# Patient Record
Sex: Female | Born: 1970 | Race: White | Hispanic: No | Marital: Single | Smoking: Current every day smoker
Health system: Southern US, Community
[De-identification: ages and names within clinical notes are randomized; demographics above are authoritative.]

## PROBLEM LIST (undated history)

## (undated) DIAGNOSIS — F329 Major depressive disorder, single episode, unspecified: Secondary | ICD-10-CM

## (undated) DIAGNOSIS — F32A Depression, unspecified: Secondary | ICD-10-CM

## (undated) HISTORY — PX: ABDOMINAL HYSTERECTOMY: SHX81

## (undated) HISTORY — PX: APPENDECTOMY: SHX54

---

## 2016-07-24 ENCOUNTER — Encounter: Payer: Self-pay | Admitting: Emergency Medicine

## 2016-07-24 ENCOUNTER — Emergency Department
Admission: EM | Admit: 2016-07-24 | Discharge: 2016-07-24 | Disposition: A | Discharge status: Home / Self Care | Payer: No Typology Code available for payment source | Attending: Emergency Medicine | Admitting: Emergency Medicine

## 2016-07-24 ENCOUNTER — Emergency Department: Payer: No Typology Code available for payment source

## 2016-07-24 DIAGNOSIS — S60411A Abrasion of left index finger, initial encounter: Secondary | ICD-10-CM | POA: Insufficient documentation

## 2016-07-24 DIAGNOSIS — S199XXA Unspecified injury of neck, initial encounter: Secondary | ICD-10-CM | POA: Diagnosis present

## 2016-07-24 DIAGNOSIS — F17210 Nicotine dependence, cigarettes, uncomplicated: Secondary | ICD-10-CM | POA: Insufficient documentation

## 2016-07-24 DIAGNOSIS — Y92410 Unspecified street and highway as the place of occurrence of the external cause: Secondary | ICD-10-CM | POA: Insufficient documentation

## 2016-07-24 DIAGNOSIS — R51 Headache: Secondary | ICD-10-CM | POA: Diagnosis not present

## 2016-07-24 DIAGNOSIS — Y9389 Activity, other specified: Secondary | ICD-10-CM | POA: Diagnosis not present

## 2016-07-24 DIAGNOSIS — Y999 Unspecified external cause status: Secondary | ICD-10-CM | POA: Insufficient documentation

## 2016-07-24 DIAGNOSIS — S161XXA Strain of muscle, fascia and tendon at neck level, initial encounter: Secondary | ICD-10-CM | POA: Diagnosis not present

## 2016-07-24 HISTORY — DX: Major depressive disorder, single episode, unspecified: F32.9

## 2016-07-24 HISTORY — DX: Depression, unspecified: F32.A

## 2016-07-24 MED ORDER — CYCLOBENZAPRINE HCL 10 MG PO TABS: 10.0000 mg | ORAL_TABLET | Freq: Three times a day (TID) | ORAL | 0 refills | Status: AC | PRN

## 2016-07-24 MED ORDER — NAPROXEN 500 MG PO TABS: 500.0000 mg | ORAL_TABLET | Freq: Two times a day (BID) | ORAL | 0 refills | Status: AC

## 2016-07-24 MED ORDER — ORPHENADRINE CITRATE 30 MG/ML IJ SOLN
60.0000 mg | Freq: Once | INTRAMUSCULAR | Status: AC
  Administered 2016-07-24: 60 mg via INTRAMUSCULAR
  Filled 2016-07-24 (×2): qty 2

## 2016-07-24 MED ORDER — KETOROLAC TROMETHAMINE 30 MG/ML IJ SOLN
60.0000 mg | Freq: Once | INTRAMUSCULAR | Status: AC
  Administered 2016-07-24: 60 mg via INTRAMUSCULAR
  Filled 2016-07-24: qty 2
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
